# Patient Record
Sex: Female | Born: 1995 | Race: Black or African American | Hispanic: No | Marital: Single | Smoking: Never smoker
Health system: Southern US, Community
[De-identification: ages and names within clinical notes are randomized; demographics above are authoritative.]

---
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
## 2013-01-26 NOTE — L&D Delivery Note (Signed)
Called to BS at approx 1am, pt feeling more pressure, otherwise pain better w epidural  BBOW at introitus,  Pt prepped for delivery and began pushing, SROM w 2nd push, and just before baby A delivery, SROM of baby B      Maria Anderson, PendingBaby [[id number]]  Delivery Note At 1:25 AM a non-viable female was delivered via Vaginal, Spontaneous Delivery (Presentation: double footling breech;  ).  APGAR: 0, 0; weight 7.8 oz (221 g).   Placenta status: Intact, Spontaneous.- w trailing membranes  Cord:  with the following complications: .  Cord pH: n/a   1000mcg cytotec given PR for bleeding prophylaxis  Routine PP oxytocin infusion  Anesthesia: Epidural  Episiotomy: None Lacerations: None Suture Repair: n/a Est. Blood Loss (mL): 100  Mom to woman's unit.  [facility]. Will leave foley catheter in for recovery, and CTO bleeding closely, if stable may remove foley before tx to 3rd floor Pt initially did not want to view babies, but w family support did hold and view them Emotional support given Chaplain consult   Shirl Weir M 08/13/2013, 3:43 AM     Maria Anderson, PendingBaby [[id number]]  Delivery Note At 1:31 AM a non-viable female was delivered via Vaginal, Spontaneous Delivery (Presentation: double footling breech  ;  ).  APGAR: 0, 0; weight 6.9 oz (196 g).

## 2013-07-31 ENCOUNTER — Other Ambulatory Visit: Payer: Self-pay

## 2013-08-01 ENCOUNTER — Other Ambulatory Visit: Payer: Self-pay | Admitting: Family Medicine

## 2013-08-01 DIAGNOSIS — Z3689 Encounter for other specified antenatal screening: Secondary | ICD-10-CM

## 2013-08-01 DIAGNOSIS — O30032 Twin pregnancy, monochorionic/diamniotic, second trimester: Secondary | ICD-10-CM

## 2013-08-10 ENCOUNTER — Other Ambulatory Visit: Payer: Self-pay | Admitting: Family Medicine

## 2013-08-10 DIAGNOSIS — Z3689 Encounter for other specified antenatal screening: Secondary | ICD-10-CM

## 2013-08-10 DIAGNOSIS — O30032 Twin pregnancy, monochorionic/diamniotic, second trimester: Secondary | ICD-10-CM

## 2013-08-11 ENCOUNTER — Ambulatory Visit (HOSPITAL_COMMUNITY)
Admission: RE | Admit: 2013-08-11 | Discharge: 2013-08-11 | Disposition: A | Discharge status: Home / Self Care | Payer: Medicaid Other | Source: Ambulatory Visit | Attending: Family Medicine | Admitting: Family Medicine

## 2013-08-11 ENCOUNTER — Other Ambulatory Visit (HOSPITAL_COMMUNITY): Payer: Self-pay | Admitting: Maternal and Fetal Medicine

## 2013-08-11 ENCOUNTER — Other Ambulatory Visit: Payer: Self-pay | Admitting: Family Medicine

## 2013-08-11 ENCOUNTER — Encounter (HOSPITAL_COMMUNITY): Payer: Self-pay

## 2013-08-11 DIAGNOSIS — O30032 Twin pregnancy, monochorionic/diamniotic, second trimester: Secondary | ICD-10-CM

## 2013-08-11 DIAGNOSIS — Z3689 Encounter for other specified antenatal screening: Secondary | ICD-10-CM

## 2013-08-11 DIAGNOSIS — Z960 Presence of urogenital implants: Secondary | ICD-10-CM

## 2013-08-11 IMAGING — US US OB DETAIL EACH ADDL GEST + 14 WK
1 series · 13 of 28 positions shown · non-contrast
Comparison: none

[Series 1: us ob detail each addl gest + 14 wk · 0.26mm/px · 13 of 173 slices shown]
[im 7/173]
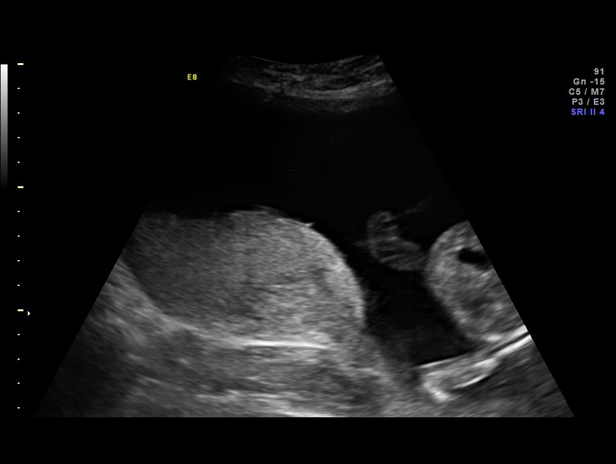
[im 20/173]
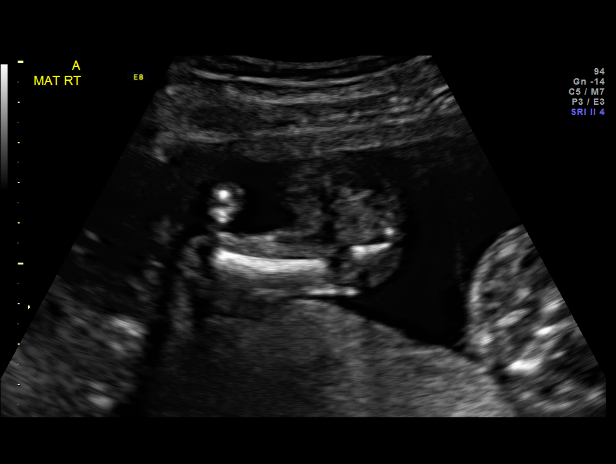
[im 32/173]
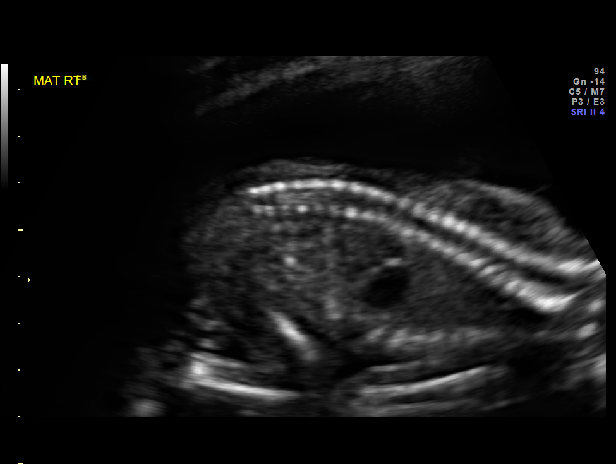
[im 45/173]
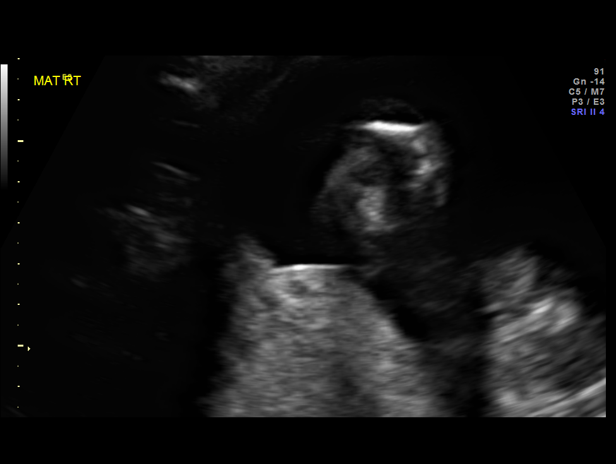
[im 58/173]
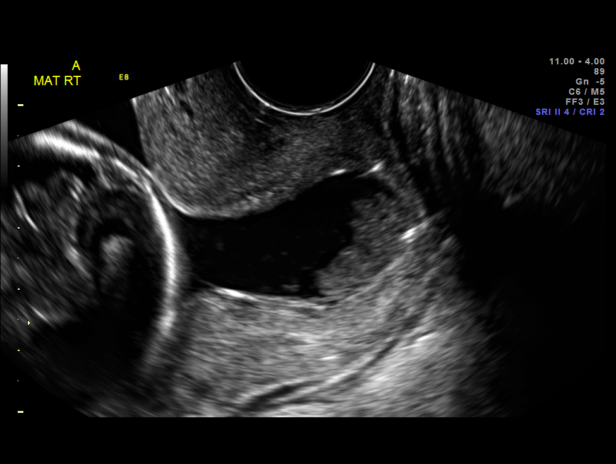
[im 71/173]
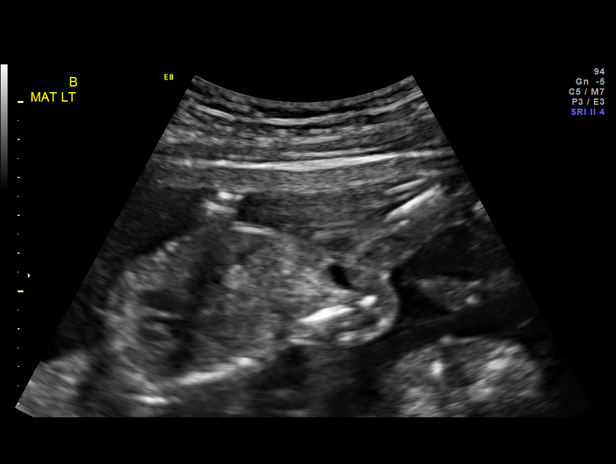
[im 90/173]
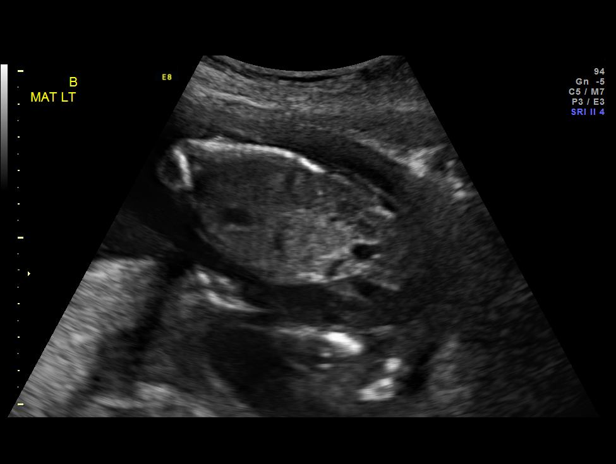
[im 102/173]
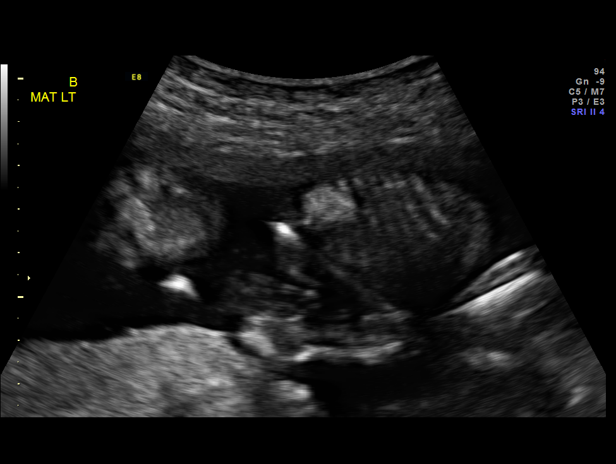
[im 115/173]
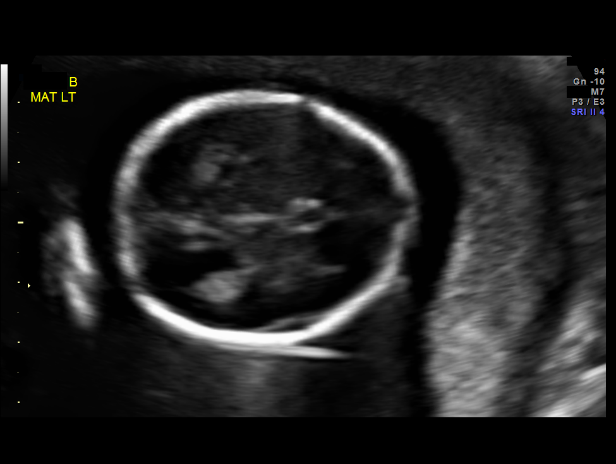
[im 128/173]
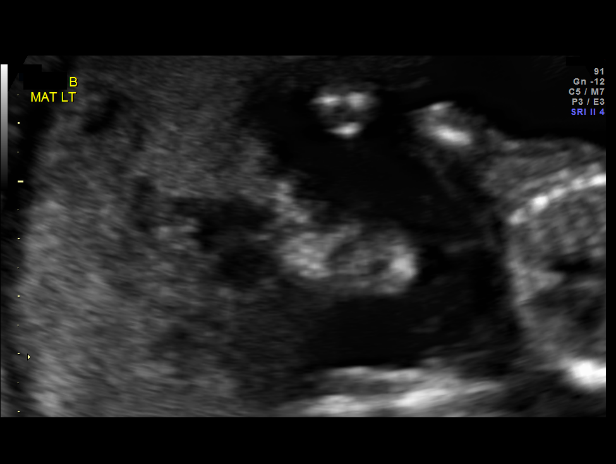
[im 141/173]
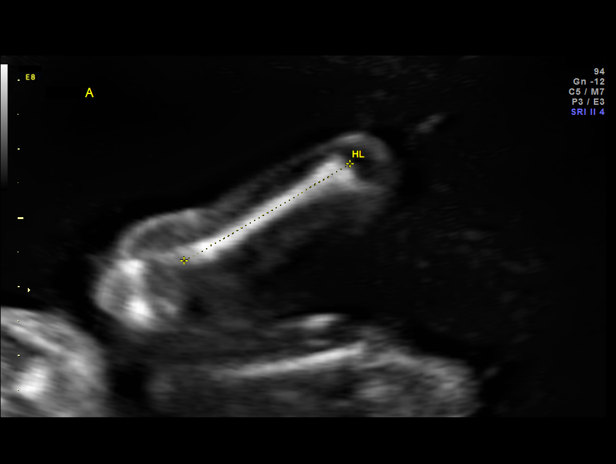
[im 153/173]
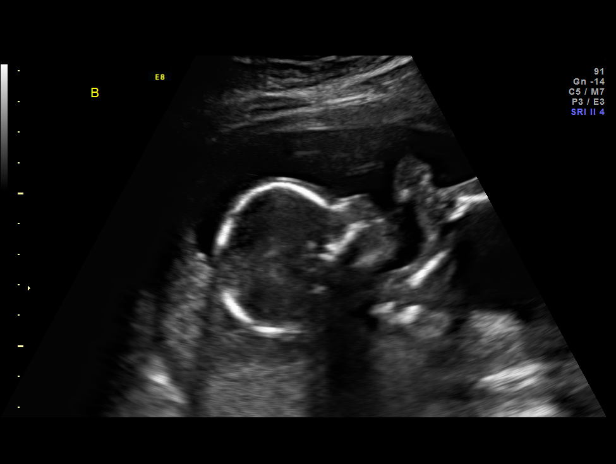
[im 166/173]
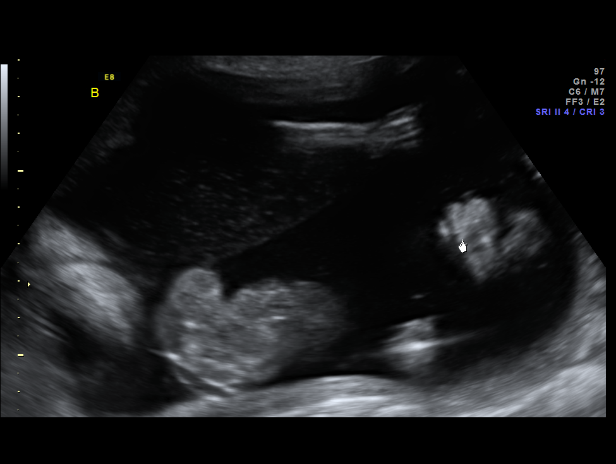

[13 of 28 positions shown; findings below may reference images not displayed]

OBSTETRICS REPORT
                      (Signed Final [DATE] [DATE])

Service(s) Provided

 US OB DETAIL + 14 WK                                  76811.0
 US OB DETAIL ADDL GEST + 14 WK                        76811.1
 US MFM OB TRANSVAGINAL                                76817.2
Indications

 Twin gestation, DANIEL D
Fetal Evaluation (Fetus A)

 Num Of Fetuses:    2
 Fetal Heart Rate:  155                          bpm
 Cardiac Activity:  Observed
 Fetal Lie:         Right Fetus
 Presentation:      Cephalic
 Placenta:          Posterior, above cervical
                    os
 P. Cord            Visualized
 Insertion:

 Membrane Desc:     Dividing
                    Membrane seen
                    - Monochorionic

 Amniotic Fluid
 AFI FV:      Subjectively within normal limits
                                             Larg Pckt:     4.9  cm
Biometry (Fetus A)

 BPD:     42.6  mm     G. Age:  18w 6d                CI:         77.5   70 - 86
 OFD:       55  mm                                    FL/HC:      16.9   15.8 -
                                                                         18
 HC:     157.5  mm     G. Age:  18w 5d       74  %    HC/AC:      1.20   1.07 -

 AC:     131.6  mm     G. Age:  18w 5d       70  %    FL/BPD:
 FL:      26.6  mm     G. Age:  18w 1d       49  %    FL/AC:      20.2   20 - 24
 HUM:     27.9  mm     G. Age:  19w 0d       83  %

 Est. FW:     240  gm      0 lb 8 oz     56  %     FW Discordancy      0 \ 5 %
Gestational Age (Fetus A)

 LMP:           18w 0d        Date:  [DATE]                 EDD:   [DATE]
 U/S Today:     18w 4d                                        EDD:   [DATE]
 Best:          18w 0d     Det. By:  LMP  ([DATE])          EDD:   [DATE]
Anatomy (Fetus A)

 Cranium:          Appears normal         Aortic Arch:      Appears normal
 Fetal Cavum:      Appears normal         Ductal Arch:      Appears normal
 Ventricles:       Appears normal         Diaphragm:        Appears normal
 Choroid Plexus:   Appears normal         Stomach:          Appears normal, left
                                                            sided
 Cerebellum:       Appears normal         Abdomen:          Appears normal
 Posterior Fossa:  Appears normal         Abdominal Wall:   Appears nml (cord
                                                            insert, abd wall)
 Nuchal Fold:      Appears normal         Cord Vessels:     Appears normal (3
                                                            vessel cord)
 Face:             Appears normal         Kidneys:          Appear normal
                   (orbits and profile)
 Lips:             Appears normal         Bladder:          Appears normal
 Heart:            Appears normal         Spine:            Appears normal
                   (4CH, axis, and
                   situs)
 RVOT:             Appears normal         Lower             Appears normal
                                          Extremities:
 LVOT:             Appears normal         Upper             Appears normal
                                          Extremities:

 Other:  Fetus appears to be a female. Heels and 5th digit appear normal.

Fetal Evaluation (Fetus B)

 Num Of Fetuses:    2
 Fetal Heart Rate:  167                          bpm
 Cardiac Activity:  Observed
 Fetal Lie:         Left Fetus
 Presentation:      Breech
 Placenta:          Posterior, above cervical
                    os
 P. Cord            Marginal insertion
 Insertion:

 Amniotic Fluid
 AFI FV:      Subjectively within normal limits
                                             Larg Pckt:     6.3  cm
Biometry (Fetus B)

 BPD:     41.1  mm     G. Age:  18w 3d                CI:         73.4   70 - 86
 OFD:       56  mm                                    FL/HC:      16.6   15.8 -
                                                                         18
 HC:     157.7  mm     G. Age:  18w 5d       74  %    HC/AC:      1.24   1.07 -

 AC:     127.1  mm     G. Age:  18w 2d       57  %    FL/BPD:
 FL:      26.2  mm     G. Age:  18w 0d       44  %    FL/AC:      20.6   20 - 24
 HUM:     26.6  mm     G. Age:  18w 3d       68  %
 CER:     19.6  mm     G. Age:  18w 6d       74  %
 NFT:      4.4  mm

 Est. FW:     228  gm      0 lb 8 oz     53  %     FW Discordancy         5  %
Gestational Age (Fetus B)

 LMP:           18w 0d        Date:  [DATE]                 EDD:   [DATE]
 U/S Today:     18w 3d                                        EDD:   [DATE]
 Best:          18w 0d     Det. By:  LMP  ([DATE])          EDD:   [DATE]
Anatomy (Fetus B)

 Cranium:          Appears normal         Aortic Arch:      Appears normal
 Fetal Cavum:      Appears normal         Ductal Arch:      Appears normal
 Ventricles:       Appears normal         Diaphragm:        Appears normal
 Choroid Plexus:   Appears normal         Stomach:          Appears normal, left
                                                            sided
 Cerebellum:       Appears normal         Abdomen:          Appears normal
 Posterior Fossa:  Appears normal         Abdominal Wall:   Appears nml (cord
                                                            insert, abd wall)
 Nuchal Fold:      Appears normal         Cord Vessels:     Appears normal (3
                                                            vessel cord)
 Face:             Appears normal         Kidneys:          Appear normal
                   (orbits and profile)
 Lips:             Appears normal         Bladder:          Appears normal
 Heart:            Appears normal         Spine:            Appears normal
                   (4CH, axis, and
                   situs)
 RVOT:             Appears normal         Lower             Appears normal
                                          Extremities:
 LVOT:             Appears normal         Upper             Appears normal
                                          Extremities:

 Other:  Fetus appears to be a female. Heels and 5th digit appear normal.
Targeted Anatomy (Fetus B)

 Fetal Central Nervous System
 Cisterna Magna:
Cervix Uterus Adnexa

 Cervical Length:    0        cm

 Cervix:       Measured transvaginally; see comments below
Comments

 Management of monochorionic/diamniotic twin pregnancies:
 begin surveillance for twin to twin transfusion syndrome (
9
 % in DANIEL D/DANIEL D twins) at 16-18 weeks and repeat ultrasounds
 every 2 weeks to monitor for discordant AFVs (> 8 cms in one
 sac and < 2 cms in the other is concerning for TTTS) and the
 presence of bladders for both twins; growth DANIEL D every 3-4
 weeks; antenatal testing at 32 weeks; delivery between 36
 and 37 weeks
Impression

 Monochorionic/diamniotic twin pregnancy at 18+0 weeks

 Normal detailed fetal anatomy x 2
 Markers of aneuploidy: none x 2
 Normal amniotic fluid volume x 2
 Measurements consistent with LMP dating
 EV views of cervix: large U-shaped funneling down to the
 external os

 The US findings were shared with Ms. DANIEL D and her
 mother. The implications of a this extensive funneling at this
 early GA with a twin gestation were discussed in detail.
 There really are no good options; however, I offered them
 expectant management, vaginal progesterone (may not help,
 but does not hurt) and a pessary. After much discussion with
 DANIEL D and her mother, they decided to use the vaginal
 progesterone and pessary. They understand the high risk for
 SROM no matter which management was choosen.  Prior to
 implementing any of the options, I discussed her situation
 with Dr. DANIEL D who was on call for [facility]. She was OK with
 whatever the patient chose to do. Therefore, a pessary was
 placed round the cervix using US guidance. Ms. DANIEL D
 tolerated the procedure well. She was also given a
 prescription for DANIEL D.
Recommendations

 Modified bedrest at home
 Follow-up ultrasound in one week to reassess the cervix and
 AFV
 DANIEL D 200 mg in vagina every night

## 2013-08-12 ENCOUNTER — Inpatient Hospital Stay (HOSPITAL_COMMUNITY): Payer: Medicaid Other | Admitting: Anesthesiology

## 2013-08-12 ENCOUNTER — Encounter (HOSPITAL_COMMUNITY): Payer: Self-pay | Admitting: *Deleted

## 2013-08-12 ENCOUNTER — Encounter (HOSPITAL_COMMUNITY): Payer: Medicaid Other | Admitting: Anesthesiology

## 2013-08-12 ENCOUNTER — Inpatient Hospital Stay (HOSPITAL_COMMUNITY): Payer: Medicaid Other

## 2013-08-12 ENCOUNTER — Inpatient Hospital Stay (HOSPITAL_COMMUNITY)
Admission: AD | Admit: 2013-08-12 | Discharge: 2013-08-13 | DRG: 779 | Disposition: A | Discharge status: Home / Self Care | Payer: Medicaid Other | Source: Ambulatory Visit | Attending: Obstetrics and Gynecology | Admitting: Obstetrics and Gynecology

## 2013-08-12 DIAGNOSIS — O99019 Anemia complicating pregnancy, unspecified trimester: Secondary | ICD-10-CM | POA: Diagnosis present

## 2013-08-12 DIAGNOSIS — N883 Incompetence of cervix uteri: Secondary | ICD-10-CM | POA: Diagnosis present

## 2013-08-12 DIAGNOSIS — O021 Missed abortion: Principal | ICD-10-CM | POA: Diagnosis present

## 2013-08-12 DIAGNOSIS — O200 Threatened abortion: Secondary | ICD-10-CM | POA: Diagnosis present

## 2013-08-12 DIAGNOSIS — O329XX0 Maternal care for malpresentation of fetus, unspecified, not applicable or unspecified: Secondary | ICD-10-CM

## 2013-08-12 DIAGNOSIS — D649 Anemia, unspecified: Secondary | ICD-10-CM | POA: Diagnosis present

## 2013-08-12 DIAGNOSIS — O3090 Multiple gestation, unspecified, unspecified trimester: Secondary | ICD-10-CM | POA: Diagnosis present

## 2013-08-12 DIAGNOSIS — O30009 Twin pregnancy, unspecified number of placenta and unspecified number of amniotic sacs, unspecified trimester: Secondary | ICD-10-CM | POA: Diagnosis present

## 2013-08-12 DIAGNOSIS — O30039 Twin pregnancy, monochorionic/diamniotic, unspecified trimester: Secondary | ICD-10-CM

## 2013-08-12 DIAGNOSIS — O3430 Maternal care for cervical incompetence, unspecified trimester: Secondary | ICD-10-CM | POA: Diagnosis present

## 2013-08-12 LAB — CBC
HEMATOCRIT: 25.1 % — AB (ref 36.0–46.0)
HEMOGLOBIN: 8.4 g/dL — AB (ref 12.0–15.0)
MCH: 30.1 pg (ref 26.0–34.0)
MCHC: 33.5 g/dL (ref 30.0–36.0)
MCV: 90 fL (ref 78.0–100.0)
Platelets: 298 10*3/uL (ref 150–400)
RBC: 2.79 MIL/uL — ABNORMAL LOW (ref 3.87–5.11)
RDW: 13.2 % (ref 11.5–15.5)
WBC: 12.2 10*3/uL — ABNORMAL HIGH (ref 4.0–10.5)

## 2013-08-12 LAB — TYPE AND SCREEN
ABO/RH(D): O POS
Antibody Screen: NEGATIVE

## 2013-08-12 MED ORDER — PHENYLEPHRINE 40 MCG/ML (10ML) SYRINGE FOR IV PUSH (FOR BLOOD PRESSURE SUPPORT)
80.0000 ug | PREFILLED_SYRINGE | INTRAVENOUS | Status: DC | PRN
  Filled 2013-08-12: qty 2

## 2013-08-12 MED ORDER — OXYTOCIN 40 UNITS IN LACTATED RINGERS INFUSION - SIMPLE MED
62.5000 mL/h | INTRAVENOUS | Status: DC
  Filled 2013-08-12: qty 1000

## 2013-08-12 MED ORDER — BUTORPHANOL TARTRATE 1 MG/ML IJ SOLN
2.0000 mg | Freq: Once | INTRAMUSCULAR | Status: AC
  Administered 2013-08-12: 2 mg via INTRAVENOUS

## 2013-08-12 MED ORDER — PHENYLEPHRINE 40 MCG/ML (10ML) SYRINGE FOR IV PUSH (FOR BLOOD PRESSURE SUPPORT)
80.0000 ug | PREFILLED_SYRINGE | INTRAVENOUS | Status: DC | PRN
  Filled 2013-08-12: qty 10
  Filled 2013-08-12: qty 2

## 2013-08-12 MED ORDER — EPHEDRINE 5 MG/ML INJ
10.0000 mg | INTRAVENOUS | Status: DC | PRN
  Filled 2013-08-12: qty 2

## 2013-08-12 MED ORDER — BUTORPHANOL TARTRATE 1 MG/ML IJ SOLN
INTRAMUSCULAR | Status: AC
  Administered 2013-08-12: 2 mg via INTRAVENOUS
  Filled 2013-08-12: qty 2

## 2013-08-12 MED ORDER — OXYCODONE-ACETAMINOPHEN 5-325 MG PO TABS: 1.0000 | ORAL_TABLET | ORAL | Status: DC | PRN

## 2013-08-12 MED ORDER — CITRIC ACID-SODIUM CITRATE 334-500 MG/5ML PO SOLN: 30.0000 mL | ORAL | Status: DC | PRN

## 2013-08-12 MED ORDER — LIDOCAINE HCL (PF) 1 % IJ SOLN
INTRAMUSCULAR | Status: DC | PRN
  Administered 2013-08-12 (×2): 5 mL
  Administered 2013-08-12: 3 mL

## 2013-08-12 MED ORDER — ACETAMINOPHEN 325 MG PO TABS: 650.0000 mg | ORAL_TABLET | ORAL | Status: DC | PRN

## 2013-08-12 MED ORDER — FENTANYL 2.5 MCG/ML BUPIVACAINE 1/10 % EPIDURAL INFUSION (WH - ANES)
14.0000 mL/h | INTRAMUSCULAR | Status: DC | PRN
  Administered 2013-08-12: 14 mL/h via EPIDURAL
  Filled 2013-08-12: qty 125

## 2013-08-12 MED ORDER — FENTANYL CITRATE 0.05 MG/ML IJ SOLN
INTRAMUSCULAR | Status: AC
  Filled 2013-08-12: qty 2

## 2013-08-12 MED ORDER — MISOPROSTOL 200 MCG PO TABS
200.0000 ug | ORAL_TABLET | ORAL | Status: DC
  Administered 2013-08-13: 1000 ug via VAGINAL
  Filled 2013-08-12 (×5): qty 1

## 2013-08-12 MED ORDER — LACTATED RINGERS IV SOLN
500.0000 mL | Freq: Once | INTRAVENOUS | Status: AC
  Administered 2013-08-12: 500 mL via INTRAVENOUS

## 2013-08-12 MED ORDER — DIPHENHYDRAMINE HCL 50 MG/ML IJ SOLN: 12.5000 mg | INTRAMUSCULAR | Status: DC | PRN

## 2013-08-12 MED ORDER — FENTANYL CITRATE 0.05 MG/ML IJ SOLN
100.0000 ug | Freq: Once | INTRAMUSCULAR | Status: AC
  Administered 2013-08-12: 100 ug via INTRAVENOUS

## 2013-08-12 MED ORDER — OXYTOCIN BOLUS FROM INFUSION
500.0000 mL | INTRAVENOUS | Status: DC
Start: 2013-08-12 — End: 2013-08-13
  Administered 2013-08-13: 500 mL via INTRAVENOUS

## 2013-08-12 MED ORDER — LACTATED RINGERS IV SOLN
INTRAVENOUS | Status: DC
  Administered 2013-08-12 (×2): via INTRAVENOUS

## 2013-08-12 MED ORDER — LACTATED RINGERS IV SOLN: 500.0000 mL | INTRAVENOUS | Status: DC | PRN

## 2013-08-12 MED ORDER — IBUPROFEN 600 MG PO TABS: 600.0000 mg | ORAL_TABLET | Freq: Four times a day (QID) | ORAL | Status: DC | PRN

## 2013-08-12 MED ORDER — LIDOCAINE HCL (PF) 1 % IJ SOLN
30.0000 mL | INTRAMUSCULAR | Status: DC | PRN
  Filled 2013-08-12: qty 30

## 2013-08-12 MED ORDER — ONDANSETRON HCL 4 MG/2ML IJ SOLN: 4.0000 mg | Freq: Four times a day (QID) | INTRAMUSCULAR | Status: DC | PRN

## 2013-08-12 MED ORDER — FENTANYL 2.5 MCG/ML BUPIVACAINE 1/10 % EPIDURAL INFUSION (WH - ANES)
INTRAMUSCULAR | Status: DC | PRN
  Administered 2013-08-12: 14 mL/h via EPIDURAL

## 2013-08-12 NOTE — MAU Note (Signed)
Pt reports intermittent abdominal pain for one hour.

## 2013-08-12 NOTE — Anesthesia Preprocedure Evaluation (Signed)
Anesthesia Evaluation  Patient identified by MRN, date of birth, ID band Patient awake    Reviewed: Allergy & Precautions, H&P , NPO status , Patient's Chart, lab work & pertinent test results  Airway Mallampati: II TM Distance: >3 FB Neck ROM: Full    Dental no notable dental hx.    Pulmonary neg pulmonary ROS,  breath sounds clear to auscultation  Pulmonary exam normal       Cardiovascular negative cardio ROS  Rhythm:Regular Rate:Normal     Neuro/Psych negative neurological ROS  negative psych ROS   GI/Hepatic negative GI ROS, Neg liver ROS,   Endo/Other  negative endocrine ROS  Renal/GU negative Renal ROS  negative genitourinary   Musculoskeletal negative musculoskeletal ROS (+)   Abdominal   Peds negative pediatric ROS (+)  Hematology  (+) anemia ,   Anesthesia Other Findings   Reproductive/Obstetrics (+) Pregnancy                           Anesthesia Physical Anesthesia Plan  ASA: II  Anesthesia Plan: Epidural   Post-op Pain Management:    Induction:   Airway Management Planned:   Additional Equipment:   Intra-op Plan:   Post-operative Plan:   Informed Consent: I have reviewed the patients History and Physical, chart, labs and discussed the procedure including the risks, benefits and alternatives for the proposed anesthesia with the patient or authorized representative who has indicated his/her understanding and acceptance.   Dental advisory given  Plan Discussed with:   Anesthesia Plan Comments:         Anesthesia Quick Evaluation
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
## 2013-08-12 NOTE — Anesthesia Procedure Notes (Signed)
Epidural Patient location during procedure: OB  Staffing Anesthesiologist: Phillips GroutARIGNAN, [place] Wanner Performed by: anesthesiologist   Preanesthetic Checklist Completed: patient identified, site marked, surgical consent, pre-op evaluation, timeout performed, IV checked, risks and benefits discussed and monitors and equipment checked  Epidural Patient position: sitting Prep: ChloraPrep Patient monitoring: heart rate, continuous pulse ox and blood pressure Approach: midline Location: L3-L4 Injection technique: LOR saline  Needle:  Needle type: Tuohy  Needle gauge: 17 G Needle length: 9 cm and 9 Needle insertion depth: 8 cm Catheter type: closed end flexible Catheter size: 20 Guage Catheter at skin depth: 12 cm Test dose: negative  Assessment Events: blood not aspirated, injection not painful, no injection resistance, negative IV test and no paresthesia  Additional Notes   Patient tolerated the insertion well without complications.

## 2013-08-12 NOTE — H&P (Signed)
Maria Anderson is a 18 y.o. female  At [redacted]w[redacted]d w twin IUP, presenting for cramping/ctx and pressure, that started about 8pm. She denies VB or LOF, unsure about FM. Pt was seen by MFM yesterday for anatomy US and found to have funneling cervix into the external os. A pessary was placed by Dr Sherrie George.   Pregnancy Course:  Pt began [facility] at 16wks, [place] for dating revealed twin IUP mono/di, otherwise WNL Had f/u US on 7/18 at MFM and cervical funneling noted w no measurable length of cervix Pessary was placed by Dr Sherrie George     Maternal Medical History:  Reason for admission: Contractions.   Contractions: Onset was 1-2 hours ago.   Frequency: regular.   Perceived severity is strong.    Prenatal complications: no prenatal complications Incompetent cervix     OB History   Grav Para Term Preterm Abortions TAB SAB Ect Mult Living   1              No past medical history on file. No past surgical history on file. Family History: family history is not on file. Social History:  reports that she has never smoked. She does not have any smokeless tobacco history on file. She reports that she does not drink alcohol or use illicit drugs.   Prenatal Transfer Tool  Maternal Diabetes: No Genetic Screening: Declined Maternal Ultrasounds/Referrals: Abnormal:  Findings:   Other: Fetal Ultrasounds or other Referrals:  Other:  cervical funneled to external os  Maternal Substance Abuse:  No Significant Maternal Medications:  None Significant Maternal Lab Results:  None Other Comments:  None  Review of Systems  All other systems reviewed and are negative.     Blood pressure 100/60, pulse 105, temperature 98 F (36.7 C), temperature source Oral, resp. rate 18, last menstrual period 04/07/2013. Maternal Exam:  Uterine Assessment: Contraction strength is moderate.  Contraction frequency is regular.   Abdomen: Patient reports no abdominal tenderness. Fetal presentation: breech  Introitus:  Normal vulva. Normal vagina.  Cervix: Cervix evaluated by sterile speculum exam and digital exam.     Fetal Exam Fetal Monitor Review: Mode: ultrasound.   Baseline rate: +FHR x2 .      Physical Exam  Nursing note and vitals reviewed. Constitutional: She is oriented to person, place, and time. She appears well-developed and well-nourished. She appears distressed.  HENT:  Head: Normocephalic.  Eyes: Pupils are equal, round, and reactive to light.  Neck: Normal range of motion.  Cardiovascular: Normal rate, regular rhythm and normal heart sounds.   Respiratory: Effort normal and breath sounds normal.  GI: Soft. Bowel sounds are normal.  Genitourinary:  Spec exam, BBOW  Footling breech  Musculoskeletal: Normal range of motion.  Neurological: She is alert and oriented to person, place, and time. She has normal reflexes.  Skin: Skin is warm and dry.  Psychiatric: She has a normal mood and affect. Her behavior is normal.    Prenatal labs: ABO, Rh:  O pos  Antibody:  neg  Rubella:  NON-IMMUNE  RPR:   NR HBsAg:   neg HIV:   neg GBS:   unknown  GC/CT - neg hgb electrophoreses WNL   Assessment/Plan: Twin IUP mono/DI at [redacted]w[redacted]d  Pessary placed by MFM on 7/17 [place] today shows feet delivered through cervix in vagina, intact BOW  Admit to b.s. Per c/w DR Richardson Dopp Routine L&D orders Consult to chaplain  Stadol 2mg  IVP Delivery imminent  Dr Richardson Dopp en route   Sanda Klein  M 08/12/2013, 10:21 PM   Agree with above.  Pt seen and examined by me.  Pelvic exam:  Intact fetal membranes with feet presenting seen in the vaginal vault. Pessary was noted in the posterior vagina and removed without difficutly.   A/P 18 wks 1 day with Mono/ Di twins cervical incompetence now with preterm labor.  I discussed with the patient that the delivery of Twin A is inevitable. And twin B's delivery would more than likely follow  She  Was advised that it is possible to  Expectantly manage twin B if the  fetus does not deliver immediately however there is the risk of chorioamnionitis  Which will increase maternal morbidity and mortality.  She desires to proceed with deliver of both twins.  She desires an epidural for pain relief.

## 2013-08-13 ENCOUNTER — Encounter (HOSPITAL_COMMUNITY): Payer: Self-pay | Admitting: *Deleted

## 2013-08-13 LAB — CBC
HCT: 24.3 % — ABNORMAL LOW (ref 36.0–46.0)
HEMOGLOBIN: 8.2 g/dL — AB (ref 12.0–15.0)
MCH: 30 pg (ref 26.0–34.0)
MCHC: 33.7 g/dL (ref 30.0–36.0)
MCV: 89 fL (ref 78.0–100.0)
Platelets: 244 10*3/uL (ref 150–400)
RBC: 2.73 MIL/uL — ABNORMAL LOW (ref 3.87–5.11)
RDW: 13.3 % (ref 11.5–15.5)
WBC: 13.8 10*3/uL — ABNORMAL HIGH (ref 4.0–10.5)

## 2013-08-13 LAB — URINE MICROSCOPIC-ADD ON

## 2013-08-13 LAB — URINALYSIS, ROUTINE W REFLEX MICROSCOPIC
Bilirubin Urine: NEGATIVE
Glucose, UA: NEGATIVE mg/dL
Hgb urine dipstick: NEGATIVE
Ketones, ur: NEGATIVE mg/dL
NITRITE: NEGATIVE
PH: 7 (ref 5.0–8.0)
Protein, ur: NEGATIVE mg/dL
UROBILINOGEN UA: 0.2 mg/dL (ref 0.0–1.0)

## 2013-08-13 LAB — WET PREP, GENITAL
CLUE CELLS WET PREP: NONE SEEN
Trich, Wet Prep: NONE SEEN
WBC, Wet Prep HPF POC: NONE SEEN
Yeast Wet Prep HPF POC: NONE SEEN

## 2013-08-13 LAB — ABO/RH: ABO/RH(D): O POS

## 2013-08-13 LAB — GROUP B STREP BY PCR: Group B strep by PCR: NEGATIVE

## 2013-08-13 MED ORDER — TETANUS-DIPHTH-ACELL PERTUSSIS 5-2.5-18.5 LF-MCG/0.5 IM SUSP: 0.5000 mL | Freq: Once | INTRAMUSCULAR | Status: DC

## 2013-08-13 MED ORDER — DIPHENHYDRAMINE HCL 25 MG PO CAPS: 25.0000 mg | ORAL_CAPSULE | Freq: Four times a day (QID) | ORAL | Status: DC | PRN

## 2013-08-13 MED ORDER — SIMETHICONE 80 MG PO CHEW: 80.0000 mg | CHEWABLE_TABLET | ORAL | Status: DC | PRN

## 2013-08-13 MED ORDER — BISACODYL 10 MG RE SUPP: 10.0000 mg | Freq: Every day | RECTAL | Status: DC | PRN

## 2013-08-13 MED ORDER — ONDANSETRON HCL 4 MG PO TABS: 4.0000 mg | ORAL_TABLET | ORAL | Status: DC | PRN

## 2013-08-13 MED ORDER — BENZOCAINE-MENTHOL 20-0.5 % EX AERO: 1.0000 "application " | INHALATION_SPRAY | CUTANEOUS | Status: DC | PRN

## 2013-08-13 MED ORDER — WITCH HAZEL-GLYCERIN EX PADS: 1.0000 "application " | MEDICATED_PAD | CUTANEOUS | Status: DC | PRN

## 2013-08-13 MED ORDER — DIBUCAINE 1 % RE OINT: 1.0000 "application " | TOPICAL_OINTMENT | RECTAL | Status: DC | PRN

## 2013-08-13 MED ORDER — FLEET ENEMA 7-19 GM/118ML RE ENEM: 1.0000 | ENEMA | Freq: Every day | RECTAL | Status: DC | PRN

## 2013-08-13 MED ORDER — MEASLES, MUMPS & RUBELLA VAC ~~LOC~~ INJ: 0.5000 mL | INJECTION | Freq: Once | SUBCUTANEOUS | Status: DC

## 2013-08-13 MED ORDER — MISOPROSTOL 200 MCG PO TABS: 1000.0000 ug | ORAL_TABLET | Freq: Once | ORAL | Status: AC

## 2013-08-13 MED ORDER — IBUPROFEN 600 MG PO TABS: 600.0000 mg | ORAL_TABLET | Freq: Four times a day (QID) | ORAL | Status: AC

## 2013-08-13 MED ORDER — IBUPROFEN 600 MG PO TABS
600.0000 mg | ORAL_TABLET | Freq: Four times a day (QID) | ORAL | Status: DC
  Administered 2013-08-13: 600 mg via ORAL
  Filled 2013-08-13: qty 1

## 2013-08-13 MED ORDER — SENNOSIDES-DOCUSATE SODIUM 8.6-50 MG PO TABS: 2.0000 | ORAL_TABLET | ORAL | Status: DC

## 2013-08-13 MED ORDER — OXYCODONE-ACETAMINOPHEN 5-325 MG PO TABS: 1.0000 | ORAL_TABLET | ORAL | Status: AC | PRN

## 2013-08-13 MED ORDER — LANOLIN HYDROUS EX OINT: TOPICAL_OINTMENT | CUTANEOUS | Status: DC | PRN

## 2013-08-13 MED ORDER — OXYCODONE-ACETAMINOPHEN 5-325 MG PO TABS
1.0000 | ORAL_TABLET | ORAL | Status: DC | PRN
  Administered 2013-08-13: 1 via ORAL
  Filled 2013-08-13: qty 1

## 2013-08-13 MED ORDER — ONDANSETRON HCL 4 MG/2ML IJ SOLN: 4.0000 mg | INTRAMUSCULAR | Status: DC | PRN

## 2013-08-13 MED ORDER — ZOLPIDEM TARTRATE 5 MG PO TABS: 5.0000 mg | ORAL_TABLET | Freq: Every evening | ORAL | Status: DC | PRN

## 2013-08-13 MED ORDER — MEASLES, MUMPS & RUBELLA VAC ~~LOC~~ INJ
0.5000 mL | INJECTION | Freq: Once | SUBCUTANEOUS | Status: AC
  Administered 2013-08-13: 0.5 mL via SUBCUTANEOUS
  Filled 2013-08-13: qty 0.5

## 2013-08-13 MED ORDER — MEDROXYPROGESTERONE ACETATE 150 MG/ML IM SUSP: 150.0000 mg | INTRAMUSCULAR | Status: DC | PRN

## 2013-08-13 NOTE — Discharge Summary (Signed)
Vaginal Delivery Discharge Summary  Maria Anderson  DOB:    05-15-1995 MRN:    [id number] CSN:    [id number]  Date of admission:                  08/12/13  Date of discharge:                   08/13/13  Procedures this admission: Vaginal delivery of 18 week Mono/Di twin gestation  Date of Delivery: 08/13/13 by Sanda Klein with Dr. Richardson Dopp in attendance  Newborn Data:    Maria, Anderson [[id number]]  Live born female  Birth Weight: 7.8 oz (221 g) APGAR: 0, 0   Maria, Anderson [[id number]]  Live born female  Birth Weight: 6.9 oz (196 g) APGAR: 0, 0   History of Present Illness:  Maria Anderson is a 18 y.o. female, G1P1, who presents at [redacted]w[redacted]d weeks gestation. The patient has been followed at the [facility] and Gynecology division of [facility]. She was admitted preterm labor and cervical dilation . Her pregnancy has been complicated by:  Patient Active Problem List   Diagnosis Date Noted  . Preterm delivery 08/13/2013  . Incompetent cervix 08/12/2013    Hospital course:  The patient was admitted for preterm labor.   Her labor was not complicated. She proceeded to have a vaginal delivery of a healthy infant. Her delivery was not complicated. Her postpartum course was complicated by the normal greiving process - support was given throughout her stay and PP depression discussed at length with encouragement to call the office if she notices any s/s of depression.  She was discharged to home on postpartum day 0, doing well.   Contraception:  To be discussed at f/u appointment  Discharge hemoglobin:  Hemoglobin  Date Value Ref Range Status  08/13/2013 8.2* 12.0 - 15.0 g/dL Final     HCT  Date Value Ref Range Status  08/13/2013 24.3* 36.0 - 46.0 % Final    Discharge Physical Exam:   General: alert, cooperative and no distress Lochia: appropriate Uterine Fundus: firm Incision: n/a DVT Evaluation: No evidence of DVT  seen on physical exam. Negative Homan's sign.  Intrapartum Procedures: spontaneous vaginal delivery of 18 weeks Mono/Di twins Postpartum Procedures: none Complications-Operative and Postpartum: none  Discharge Diagnoses: Preterm delivery of 18 weeks twin gestation  Discharge Information:  Activity:           unrestricted Diet:                routine Medications: Ibuprofen and Percocet Condition:      stable Instructions:   Postpartum Care After Vaginal Delivery  After you deliver your newborn (postpartum period), the usual stay in the hospital is 24 72 hours. If there were problems with your labor or delivery, or if you have other medical problems, you might be in the hospital longer.  While you are in the hospital, you will receive help and instructions on how to care for yourself and your newborn during the postpartum period.  While you are in the hospital:  Be sure to tell your nurses if you have pain or discomfort, as well as where you feel the pain and what makes the pain worse.  If you had an incision made near your vagina (episiotomy) or if you had some tearing during delivery, the nurses may put ice packs on your episiotomy or tear. The ice packs may help to reduce the pain and swelling.  If you are breastfeeding, you may feel uncomfortable contractions of your uterus for a couple of weeks. This is normal. The contractions help your uterus get back to normal size.  It is normal to have some bleeding after delivery.  For the first 1 3 days after delivery, the flow is red and the amount may be similar to a period.  It is common for the flow to start and stop.  In the first few days, you may pass some small clots. Let your nurses know if you begin to pass large clots or your flow increases.  Do not  flush blood clots down the toilet before having the nurse look at them.  During the next 3 10 days after delivery, your flow should become more watery and pink or brown-tinged  in color.  Ten to fourteen days after delivery, your flow should be a small amount of yellowish-white discharge.  The amount of your flow will decrease over the first few weeks after delivery. Your flow may stop in 6 8 weeks. Most women have had their flow stop by 12 weeks after delivery.  You should change your sanitary pads frequently.  Wash your hands thoroughly with soap and water for at least 20 seconds after changing pads, using the toilet, or before holding or feeding your newborn.  You should feel like you need to empty your bladder within the first 6 8 hours after delivery.  In case you become weak, lightheaded, or faint, call your nurse before you get out of bed for the first time and before you take a shower for the first time.  Within the first few days after delivery, your breasts may begin to feel tender and full. This is called engorgement. Breast tenderness usually goes away within 48 72 hours after engorgement occurs. You may also notice milk leaking from your breasts. If you are not breastfeeding, do not stimulate your breasts. Breast stimulation can make your breasts produce more milk.  Spending as much time as possible with your newborn is very important. During this time, you and your newborn can feel close and get to know each other. Having your newborn stay in your room (rooming in) will help to strengthen the bond with your newborn. It will give you time to get to know your newborn and become comfortable caring for your newborn.  Your hormones change after delivery. Sometimes the hormone changes can temporarily cause you to feel sad or tearful. These feelings should not last more than a few days. If these feelings last longer than that, you should talk to your caregiver.  If desired, talk to your caregiver about methods of family planning or contraception.  Talk to your caregiver about immunizations. Your caregiver may want you to have the following immunizations before  leaving the hospital:  Tetanus, diphtheria, and pertussis (Tdap) or tetanus and diphtheria (Td) immunization. It is very important that you and your family (including grandparents) or others caring for your newborn are up-to-date with the Tdap or Td immunizations. The Tdap or Td immunization can help protect your newborn from getting ill.  Rubella immunization.  Varicella (chickenpox) immunization.  Influenza immunization. You should receive this annual immunization if you did not receive the immunization during your pregnancy. Document Released: 11/09/2006 Document Revised: 10/07/2011 Document Reviewed: 09/09/2011 [facility] Patient Information 2014 [place].   Postpartum Depression and Baby Blues  The postpartum period begins right after the birth of a baby. During this time, there is often a great amount  of joy and excitement. It is also a time of considerable changes in the life of the parent(s). Regardless of how many times a mother gives birth, each child brings new challenges and dynamics to the family. It is not unusual to have feelings of excitement accompanied by confusing shifts in moods, emotions, and thoughts. All mothers are at risk of developing postpartum depression or the "baby blues." These mood changes can occur right after giving birth, or they may occur many months after giving birth. The baby blues or postpartum depression can be mild or severe. Additionally, postpartum depression can resolve rather quickly, or it can be a long-term condition. CAUSES Elevated hormones and their rapid decline are thought to be a main cause of postpartum depression and the baby blues. There are a number of hormones that radically change during and after pregnancy. Estrogen and progesterone usually decrease immediately after delivering your baby. The level of thyroid hormone and various cortisol steroids also rapidly drop. Other factors that play a major role in these changes include major  life events and genetics.  RISK FACTORS If you have any of the following risks for the baby blues or postpartum depression, know what symptoms to watch out for during the postpartum period. Risk factors that may increase the likelihood of getting the baby blues or postpartum depression include:  Havinga personal or family history of depression.  Having depression while being pregnant.  Having premenstrual or oral contraceptive-associated mood issues.  Having exceptional life stress.  Having marital conflict.  Lacking a social support network.  Having a baby with special needs.  Having health problems such as diabetes. SYMPTOMS Baby blues symptoms include:  Brief fluctuations in mood, such as going from extreme happiness to sadness.  Decreased concentration.  Difficulty sleeping.  Crying spells, tearfulness.  Irritability.  Anxiety. Postpartum depression symptoms typically begin within the first month after giving birth. These symptoms include:  Difficulty sleeping or excessive sleepiness.  Marked weight loss.  Agitation.  Feelings of worthlessness.  Lack of interest in activity or food. Postpartum psychosis is a very concerning condition and can be dangerous. Fortunately, it is rare. Displaying any of the following symptoms is cause for immediate medical attention. Postpartum psychosis symptoms include:  Hallucinations and delusions.  Bizarre or disorganized behavior.  Confusion or disorientation. DIAGNOSIS  A diagnosis is made by an evaluation of your symptoms. There are no medical or lab tests that lead to a diagnosis, but there are various questionnaires that a caregiver may use to identify those with the baby blues, postpartum depression, or psychosis. Often times, a screening tool called the [place] Postnatal Depression Scale is used to diagnose depression in the postpartum period.  TREATMENT The baby blues usually goes away on its own in 1 to 2 weeks.  Social support is often all that is needed. You should be encouraged to get adequate sleep and rest. Occasionally, you may be given medicines to help you sleep.  Postpartum depression requires treatment as it can last several months or longer if it is not treated. Treatment may include individual or group therapy, medicine, or both to address any social, physiological, and psychological factors that may play a role in the depression. Regular exercise, a healthy diet, rest, and social support may also be strongly recommended.  Postpartum psychosis is more serious and needs treatment right away. Hospitalization is often needed. HOME CARE INSTRUCTIONS  Get as much rest as you can. Nap when the baby sleeps.  Exercise regularly. Some women find  yoga and walking to be beneficial.  Eat a balanced and nourishing diet.  Do little things that you enjoy. Have a cup of tea, take a bubble bath, read your favorite magazine, or listen to your favorite music.  Avoid alcohol.  Ask for help with household chores, cooking, grocery shopping, or running errands as needed. Do not try to do everything.  Talk to people close to you about how you are feeling. Get support from your partner, family members, friends, or other new moms.  Try to stay positive in how you think. Think about the things you are grateful for.  Do not spend a lot of time alone.  Only take medicine as directed by your caregiver.  Keep all your postpartum appointments.  Let your caregiver know if you have any concerns. SEEK MEDICAL CARE IF: You are having a reaction or problems with your medicine. SEEK IMMEDIATE MEDICAL CARE IF:  You have suicidal feelings.  You feel you may harm the baby or someone else. Document Released: 10/17/2003 Document Revised: 04/06/2011 Document Reviewed: 11/18/2010 [facility] Patient Information 2014 [place], [place].   Discharge to: home  Follow-up Information   Follow up with [facility]. Schedule an appointment as soon as possible for a visit in 2 weeks. (Call with any questions or concerns)    Specialty:  Obstetrics and Gynecology   Contact information:   [address] [place] [address] [phone]       Haroldine Laws 08/13/2013

## 2013-08-13 NOTE — Progress Notes (Signed)
Acknowledged MD order for support due to loss of pre-term twins   Parents declined visit with the [facility].  Spoke with patient and her mother.   Her mother seems very supportive.  Acknowledged their loss.   Family did not want to talk about their loss at the time of CSW visit.  Offered to return if they would like.   Provided them with support group information.  They were receptive to the information.  Informed them of social work Surveyor, miningavailability.

## 2013-08-13 NOTE — Discharge Instructions (Signed)
Care After Delivery    It is normal to have some bleeding after delivery.  For the first 1-3 days after delivery, the flow is red and the amount may be similar to a period.  It is common for the flow to start and stop.  In the first few days, you may pass some small clots. Let the office know if you begin to pass large clots or your flow increases.  During the next 3-10 days after delivery, your flow should become more watery and pink or brown-tinged in color.  Ten to fourteen days after delivery, your flow should be a small amount of yellowish-white discharge.  The amount of your flow will decrease over the first few weeks after delivery. Your flow may stop in 6-8 weeks. Most women have had their flow stop by 12 weeks after delivery.  You should change your sanitary pads frequently.  Wash your hands thoroughly with soap and water for at least 20 seconds after changing pads, or using the toilet.  Let the office know if you become weak, lightheaded, or faint.  Within the first few days after delivery, your breasts may begin to feel tender and full. This is called engorgement. Breast tenderness usually goes away within 48-72 hours after engorgement occurs. You may also notice milk leaking from your breasts. Do not stimulate your breasts. Breast stimulation can make your breasts produce more milk.  Your hormones change after delivery. Sometimes the hormone changes can temporarily cause you to feel sad or tearful.   If desired, talk to your caregiver about methods of family planning or contraception.  Talk to your caregiver about immunizations. Your caregiver may want you to have the following immunizations before leaving the hospital:  Tetanus, diphtheria, and pertussis (Tdap) or tetanus and diphtheria (Td) immunization. It is very important that you and your family (including grandparents) or others caring for your newborn are up-to-date with the Tdap or Td immunizations. The Tdap or Td  immunization can help protect your newborn from getting ill.  Rubella immunization.  Varicella (chickenpox) immunization.  Influenza immunization. You should receive this annual immunization if you did not receive the immunization during your pregnancy. Document Released: 11/09/2006 Document Revised: 10/07/2011 Document Reviewed: 09/09/2011 [facility] Patient Information 2015 [place], [place]. This information is not intended to replace advice given to you by your health care provider. Make sure you discuss any questions you have with your health care provider.  Postpartum Depression and Baby Blues The postpartum period begins right after the birth of a baby. During this time, there is often a great amount of joy and excitement. It is not unusual to have confusing shifts in moods, emotions, and thoughts. All mothers are at risk of developing postpartum depression or the "baby blues." These mood changes can occur right after giving birth, or they may occur many months after giving birth. The baby blues or postpartum depression can be mild or severe. Additionally, postpartum depression can go away rather quickly, or it can be a long-term condition.  CAUSES Raised hormone levels and the rapid drop in those levels are thought to be a main cause of postpartum depression and the baby blues. A number of hormones change during and after pregnancy. Estrogen and progesterone usually decrease right after the delivery of your baby. The levels of thyroid hormone and various cortisol steroids also rapidly drop. Other factors that play a role in these mood changes include major life events and genetics.  RISK FACTORS If you have any of  the following risks for the baby blues or postpartum depression, know what symptoms to watch out for during the postpartum period. Risk factors that may increase the likelihood of getting the baby blues or postpartum depression include:  Having a personal or family history of  depression.   Having depression while being pregnant.   Having premenstrual mood issues or mood issues related to oral contraceptives.  Having a lot of life stress.   Having marital conflict.   Lacking a social support network.   Having a baby with special needs.   Having health problems, such as diabetes.  SIGNS AND SYMPTOMS Symptoms of baby blues include:  Brief changes in mood, such as going from extreme happiness to sadness.  Decreased concentration.   Difficulty sleeping.   Crying spells, tearfulness.   Irritability.   Anxiety.  Symptoms of postpartum depression typically begin within the first month after giving birth. These symptoms include:  Difficulty sleeping or excessive sleepiness.   Marked weight loss.   Agitation.   Feelings of worthlessness.   Lack of interest in activity or food.  Postpartum psychosis is a very serious condition and can be dangerous. Fortunately, it is rare. Displaying any of the following symptoms is cause for immediate medical attention. Symptoms of postpartum psychosis include:   Hallucinations and delusions.   Bizarre or disorganized behavior.   Confusion or disorientation.  DIAGNOSIS  A diagnosis is made by an evaluation of your symptoms. There are no medical or lab tests that lead to a diagnosis, but there are various questionnaires that a health care provider may use to identify those with the baby blues, postpartum depression, or psychosis. Often, a screening tool called the [place] Postnatal Depression Scale is used to diagnose depression in the postpartum period.  TREATMENT The baby blues usually goes away on its own in 1-2 weeks. Social support is often all that is needed. You will be encouraged to get adequate sleep and rest. Occasionally, you may be given medicines to help you sleep.  Postpartum depression requires treatment because it can last several months or longer if it is not treated.  Treatment may include individual or group therapy, medicine, or both to address any social, physiological, and psychological factors that may play a role in the depression. Regular exercise, a healthy diet, rest, and social support may also be strongly recommended.  Postpartum psychosis is more serious and needs treatment right away. Hospitalization is often needed. HOME CARE INSTRUCTIONS  Get as much rest as you can.  Exercise regularly. Some women find yoga and walking to be beneficial.   Eat a balanced and nourishing diet.   Do little things that you enjoy. Have a cup of tea, take a bubble bath, read your favorite magazine, or listen to your favorite music.  Avoid alcohol.   Ask for help with household chores, cooking, grocery shopping, or running errands as needed. Do not try to do everything.   Talk to people close to you about how you are feeling. Get support from your partner, family members, or friends.  Try to stay positive in how you think. Think about the things you are grateful for.   Do not spend a lot of time alone.   Only take over-the-counter or prescription medicine as directed by your health care provider.  Keep all your postpartum appointments.   Let your health care provider know if you have any concerns.  SEEK MEDICAL CARE IF: You are having a reaction to or problems with your  medicine. SEEK IMMEDIATE MEDICAL CARE IF:  You have suicidal feelings.   You think you may harm the baby or someone else. MAKE SURE YOU:  Understand these instructions.  Will watch your condition.  Will get help right away if you are not doing well or get worse. Document Released: 10/17/2003 Document Revised: 01/17/2013 Document Reviewed: 10/24/2012 [facility] Patient Information 2015 [place]. This information is not intended to replace advice given to you by your health care provider. Make sure you discuss any questions you have with your health care provider.

## 2013-08-13 NOTE — Progress Notes (Signed)
Discharge instructions reviewed with patient and friend.  Both state understanding of home care, medications, activity, signs/symptoms to report to MD and return MD office visit.  No home equipment needed and patients mother will assist with her care @ home.  Patient discharged in stable condition per wheelchair with staff without incident.

## 2013-08-13 NOTE — Progress Notes (Signed)
Patient ID: Maria Anderson, female   DOB: 16-Jan-1996, 18 y.o.   MRN: [id number] Maria Anderson is a 18 y.o. G1P0 at 6617w2d admitted for incompetent cervix, PTL, twin gestation   Subjective: rcv'd some benefit from IV stadol, anxious, appropriately grieving, feels lots of pressure now   Objective: BP 116/62  Pulse 85  Temp(Src) 98.5 F (36.9 C) (Oral)  Resp 18  Ht 5\' 7"  (1.702 m)  Wt 163 lb (73.936 kg)  BMI 25.52 kg/m2  SpO2 100%  LMP 04/07/2013  Total I/O In: -  Out: 500 [Urine:400; Blood:100]  FHT:  +FHR x2 per US  UC:   toco n/a   SVE:   C/c/+2 per Dr Richardson Doppole    Assessment / Plan:  Labor: PTL cervix complete Preeclampsia:  no s/s Fetal Wellbeing:  n/a  Pain Control:  Epidural Anticipated MOD:  NSVD  [facility], cervix complete w BBOW in vagina, footling breech  Pessary removed Dr Richardson Doppole discussed options w pt, including expectant mgmnt after delivery of twin A or augmentation to labor, risks of infection, bleeding discussed.  Pt agrees to augmentation,  Discussed pain medicine options including PCA vs. Epidural, pt desires epidural       Rambo Sarafian M 08/13/2013, 3:36 AM

## 2013-08-14 LAB — GC/CHLAMYDIA PROBE AMP
CT Probe RNA: NEGATIVE
GC Probe RNA: NEGATIVE

## 2013-08-14 NOTE — Anesthesia Postprocedure Evaluation (Signed)
  Anesthesia Post-op Note  Patient stable following vaginal delivery of 18 week twins.  Discharged home prior to anesthesia post-op visit.

## 2013-08-18 ENCOUNTER — Other Ambulatory Visit (HOSPITAL_COMMUNITY): Payer: Medicaid Other

## 2013-08-25 ENCOUNTER — Other Ambulatory Visit (HOSPITAL_COMMUNITY): Payer: Medicaid Other

## 2013-11-27 ENCOUNTER — Encounter (HOSPITAL_COMMUNITY): Payer: Self-pay | Admitting: *Deleted
